# Patient Record
Sex: Female | Born: 1965 | Race: White | Hispanic: No | Marital: Married | Smoking: Never smoker
Health system: Southern US, Community
[De-identification: ages and names within clinical notes are randomized; demographics above are authoritative.]

## PROBLEM LIST (undated history)

## (undated) DIAGNOSIS — G43909 Migraine, unspecified, not intractable, without status migrainosus: Secondary | ICD-10-CM

## (undated) HISTORY — DX: Migraine, unspecified, not intractable, without status migrainosus: G43.909

---

## 2008-09-16 ENCOUNTER — Ambulatory Visit: Payer: Self-pay

## 2008-10-19 ENCOUNTER — Ambulatory Visit: Payer: Self-pay

## 2011-10-11 ENCOUNTER — Ambulatory Visit: Payer: Self-pay

## 2015-03-02 ENCOUNTER — Encounter: Payer: Self-pay | Admitting: *Deleted

## 2015-03-11 ENCOUNTER — Ambulatory Visit: Payer: Self-pay | Admitting: General Surgery

## 2015-03-25 ENCOUNTER — Encounter: Payer: Self-pay | Admitting: General Surgery

## 2015-03-25 ENCOUNTER — Ambulatory Visit (INDEPENDENT_AMBULATORY_CARE_PROVIDER_SITE_OTHER): Payer: No Typology Code available for payment source | Admitting: General Surgery

## 2015-03-25 VITALS — BP 100/60 | HR 80 | Resp 12 | Ht 61.0 in | Wt 149.0 lb

## 2015-03-25 DIAGNOSIS — Z1239 Encounter for other screening for malignant neoplasm of breast: Secondary | ICD-10-CM

## 2015-03-25 NOTE — Patient Instructions (Signed)
Continue self breast exams. Call office for any new breast issues or concerns. 

## 2015-03-25 NOTE — Progress Notes (Signed)
Patient ID: Vanessa Berry, female   DOB: 04-13-1966, 49 y.o.   MRN: [id number]  Chief Complaint  Patient presents with  . Other    left breast mass    HPI Vanessa Berry is a 49 y.o. female here for evaluation of a left breast mass. Her most recent mammogram was on 01/26/15 and was normal.  She states the area has been there since 2013 when she had an ultrasound as well but no biopsy has been done. Patient does perform regular self breast checks and gets regular mammograms done.  Denies family history of breast cancer. Denies any breast trauma.    HPI  Past Medical History  Diagnosis Date  . Migraine     Past Surgical History  Procedure Laterality Date  . Cesarean section  08-01-90    No family history on file.  Social History History  Substance Use Topics  . Smoking status: Never Smoker   . Smokeless tobacco: Never Used  . Alcohol Use: No    No Known Allergies  Current Outpatient Prescriptions  Medication Sig Dispense Refill  . aspirin-acetaminophen-caffeine (EXCEDRIN MIGRAINE) 250-250-65 MG per tablet Take 1 tablet by mouth every 6 (six) hours as needed for headache.     No current facility-administered medications for this visit.    Review of Systems Review of Systems  Constitutional: Negative.   Respiratory: Negative.   Cardiovascular: Negative.     Blood pressure 100/60, pulse 80, resp. rate 12, height 5\' 1"  (1.549 m), weight 149 lb (67.586 kg), last menstrual period 03/04/2015.  Physical Exam Physical Exam  Constitutional: She is oriented to person, place, and time. She appears well-developed and well-nourished.  Neck: Neck supple.  Cardiovascular: Normal rate, regular rhythm and normal heart sounds.   Pulmonary/Chest: Effort normal and breath sounds normal. Right breast exhibits no inverted nipple, no mass, no nipple discharge, no skin change and no tenderness. Left breast exhibits no inverted nipple, no mass, no nipple discharge, no skin change and no  tenderness.    Lymphadenopathy:    She has no cervical adenopathy.    She has no axillary adenopathy.  Neurological: She is alert and oriented to person, place, and time.  Skin: Skin is warm and dry.    Data Reviewed Office notes from Verlene Mayer, M.D. dated 03/02/2015 were reviewed. Small lesion in the left breast 2:00 discussed. No change from March 2016 exam.  Imaging studies dated 10/11/2011 in the upper outer aspect of the right breast purported 11 mm cyst. Emma grams otherwise unremarkable.  Mammogram dated 01/26/2015 completed at [facility] showed extremely dense breasts without suspicious mass or calcification. BI-RADS-1.  Assessment    Benign breast exam, past history breast cyst.    Plan    Oh intervention is required at this time. Annual screening mammograms will be appropriate.     Follow up as needed. Continue self breast exams. Call office for any new breast issues or concerns.   PCP:  No Pcp  Ref: Dr Wynona Luna, [place] 03/27/2015, 10:25 AM

## 2015-03-27 DIAGNOSIS — Z0000 Encounter for general adult medical examination without abnormal findings: Secondary | ICD-10-CM | POA: Insufficient documentation

## 2017-07-26 ENCOUNTER — Ambulatory Visit (INDEPENDENT_AMBULATORY_CARE_PROVIDER_SITE_OTHER): Payer: No Typology Code available for payment source | Admitting: Obstetrics & Gynecology

## 2017-07-26 ENCOUNTER — Encounter: Payer: Self-pay | Admitting: Obstetrics & Gynecology

## 2017-07-26 VITALS — BP 122/80 | HR 81 | Ht 61.0 in | Wt 160.0 lb

## 2017-07-26 DIAGNOSIS — Z1329 Encounter for screening for other suspected endocrine disorder: Secondary | ICD-10-CM

## 2017-07-26 DIAGNOSIS — Z124 Encounter for screening for malignant neoplasm of cervix: Secondary | ICD-10-CM

## 2017-07-26 DIAGNOSIS — Z1231 Encounter for screening mammogram for malignant neoplasm of breast: Secondary | ICD-10-CM

## 2017-07-26 DIAGNOSIS — N812 Incomplete uterovaginal prolapse: Secondary | ICD-10-CM | POA: Diagnosis not present

## 2017-07-26 DIAGNOSIS — Z0000 Encounter for general adult medical examination without abnormal findings: Secondary | ICD-10-CM | POA: Diagnosis not present

## 2017-07-26 DIAGNOSIS — Z131 Encounter for screening for diabetes mellitus: Secondary | ICD-10-CM | POA: Diagnosis not present

## 2017-07-26 DIAGNOSIS — Z1321 Encounter for screening for nutritional disorder: Secondary | ICD-10-CM | POA: Diagnosis not present

## 2017-07-26 DIAGNOSIS — Z1239 Encounter for other screening for malignant neoplasm of breast: Secondary | ICD-10-CM

## 2017-07-26 DIAGNOSIS — Z13220 Encounter for screening for lipoid disorders: Secondary | ICD-10-CM

## 2017-07-26 DIAGNOSIS — N8111 Cystocele, midline: Secondary | ICD-10-CM

## 2017-07-26 NOTE — Patient Instructions (Signed)
PAP every three years Mammogram every year    Call [phone] to schedule at [facility] Colonoscopy every 10 years Labs yearly 

## 2017-07-26 NOTE — Progress Notes (Signed)
HPI:      Ms. Vanessa Berry is a 51 y.o. G3P0 who LMP was Patient's last menstrual period was 07/08/2017., she presents today for her annual examination. The patient has no complaints today. The patient is sexually active. Her last pap: approximate date 2016 and was normal and last mammogram: approximate date 2016 and was normal. The patient does perform self breast exams.  There is no notable family history of breast or ovarian cancer in her family.  The patient has regular exercise: yes.  The patient denies current symptoms of depression.    C/o stress urinary incontinence sx's of LOU with cough, exercise.  Wears pad daily.  No nocturia or freq.  Also c/o perineal discomfort at times from where epis. done several years ago.  Reg cycles. No menopausal sx's.  GYN History: Contraception: condoms  PMHx: Past Medical History:  Diagnosis Date  . Migraine    Past Surgical History:  Procedure Laterality Date  . CESAREAN SECTION  08-01-90   Family History  Problem Relation Age of Onset  . Cancer Mother   . Hyperlipidemia Mother    Social History  Substance Use Topics  . Smoking status: Never Smoker  . Smokeless tobacco: Never Used  . Alcohol use No    Current Outpatient Prescriptions:  .  aspirin-acetaminophen-caffeine (EXCEDRIN MIGRAINE) 250-250-65 MG per tablet, Take 1 tablet by mouth every 6 (six) hours as needed for headache., Disp: , Rfl:  Allergies: Patient has no known allergies.  Review of Systems  Constitutional: Negative for chills, fever and malaise/fatigue.  HENT: Negative for congestion, sinus pain and sore throat.   Eyes: Negative for blurred vision and pain.  Respiratory: Negative for cough and wheezing.   Cardiovascular: Negative for chest pain and leg swelling.  Gastrointestinal: Negative for abdominal pain, constipation, diarrhea, heartburn, nausea and vomiting.  Genitourinary: Negative for dysuria, frequency, hematuria and urgency.  Musculoskeletal:  Negative for back pain, joint pain, myalgias and neck pain.  Skin: Negative for itching and rash.  Neurological: Negative for dizziness, tremors and weakness.  Endo/Heme/Allergies: Does not bruise/bleed easily.  Psychiatric/Behavioral: Negative for depression. The patient is not nervous/anxious and does not have insomnia.     Objective: BP 122/80   Pulse 81   Ht 5\' 1"  (1.549 m)   Wt 160 lb (72.6 kg)   LMP 07/08/2017   BMI 30.23 kg/m   Filed Weights   07/26/17 1010  Weight: 160 lb (72.6 kg)   Body mass index is 30.23 kg/m. Physical Exam  Constitutional: She is oriented to person, place, and time. She appears well-developed and well-nourished. No distress.  Genitourinary: Rectum normal, vagina normal and uterus normal. Pelvic exam was performed with patient supine. There is no rash or lesion on the right labia. There is no rash or lesion on the left labia. Vagina exhibits no lesion. No bleeding in the vagina. Right adnexum does not display mass and does not display tenderness. Left adnexum does not display mass and does not display tenderness. Cervix does not exhibit motion tenderness, lesion, friability or polyp.   Uterus is mobile and anteverted. Uterus is not enlarged or exhibiting a mass.  Genitourinary Comments: Gr 2 uterine prolapse into vagina, anteverted as well into bladder Gr 2 cystocele Mild posterior perineal disruption from prior episiotomy  HENT:  Head: Normocephalic and atraumatic. Head is without laceration.  Right Ear: Hearing normal.  Left Ear: Hearing normal.  Nose: No epistaxis.  No foreign bodies.  Mouth/Throat: Uvula is midline, oropharynx  is clear and moist and mucous membranes are normal.  Eyes: Pupils are equal, round, and reactive to light.  Neck: Normal range of motion. Neck supple. No thyromegaly present.  Cardiovascular: Normal rate and regular rhythm.  Exam reveals no gallop and no friction rub.   No murmur heard. Pulmonary/Chest: Effort normal and  breath sounds normal. No respiratory distress. She has no wheezes. Right breast exhibits no mass, no skin change and no tenderness. Left breast exhibits no mass, no skin change and no tenderness.  Abdominal: Soft. Bowel sounds are normal. She exhibits no distension. There is no tenderness. There is no rebound.  Musculoskeletal: Normal range of motion.  Neurological: She is alert and oriented to person, place, and time. No cranial nerve deficit.  Skin: Skin is warm and dry.  Psychiatric: She has a normal mood and affect. Judgment normal.  Vitals reviewed.   Assessment:  ANNUAL EXAM 1. Annual physical exam   2. Screening for cervical cancer   3. Screening for breast cancer   4. Encounter for vitamin deficiency screening   5. Screening for cholesterol level   6. Screening for thyroid disorder   7. Screening for diabetes mellitus      Screening Plan:            1.  Cervical Screening-  Pap smear done today  2. Breast screening- Exam annually and mammogram>40 planned   3. Colonoscopy every 10 years, Hemoccult testing - after age 60  4. Labs To return fasting at a later date  5. Counseling for contraception: no method  Other:  1. Annual physical exam  2. Screening for cervical cancer - IGP, Aptima HPV  3. Screening for breast cancer - MM DIGITAL SCREENING BILATERAL; Future  4. Encounter for vitamin deficiency screening - VITAMIN D 25 Hydroxy (Vit-D Deficiency, Fractures); Future  5. Screening for cholesterol level - Lipid panel; Future  6. Screening for thyroid disorder - TSH; Future  7. Screening for diabetes mellitus - Hemoglobin A1c; Future  Counseled about tx for prolapse and GSI- pessary vs surgery; would rec TLH BS, Ant Repair and TVT sling, also with perineorrhaphy.  To consider.    F/U  Return in about 1 year (around 07/26/2018) for Annual, also lab visit soon.  Barnett Applebaum, MD, [facility], [facility] 07/26/2017  10:42 AM

## 2017-07-27 ENCOUNTER — Other Ambulatory Visit: Payer: No Typology Code available for payment source

## 2017-07-27 DIAGNOSIS — Z1321 Encounter for screening for nutritional disorder: Secondary | ICD-10-CM

## 2017-07-27 DIAGNOSIS — Z13220 Encounter for screening for lipoid disorders: Secondary | ICD-10-CM

## 2017-07-27 DIAGNOSIS — Z131 Encounter for screening for diabetes mellitus: Secondary | ICD-10-CM

## 2017-07-27 DIAGNOSIS — Z1329 Encounter for screening for other suspected endocrine disorder: Secondary | ICD-10-CM

## 2017-07-28 LAB — IGP, APTIMA HPV
HPV Aptima: NEGATIVE
PAP Smear Comment: 0

## 2017-07-28 LAB — LIPID PANEL
CHOLESTEROL TOTAL: 186 mg/dL (ref 100–199)
Chol/HDL Ratio: 3.2 ratio (ref 0.0–4.4)
HDL: 59 mg/dL (ref >39)
LDL Calculated: 111 mg/dL — ABNORMAL HIGH (ref 0–99)
TRIGLYCERIDES: 78 mg/dL (ref 0–149)
VLDL Cholesterol Cal: 16 mg/dL (ref 5–40)

## 2017-07-28 LAB — TSH: TSH: 4.45 u[IU]/mL (ref 0.450–4.500)

## 2017-07-28 LAB — VITAMIN D 25 HYDROXY (VIT D DEFICIENCY, FRACTURES): VIT D 25 HYDROXY: 41 ng/mL (ref 30.0–100.0)

## 2017-07-28 LAB — HEMOGLOBIN A1C
Est. average glucose Bld gHb Est-mCnc: 103 mg/dL
Hgb A1c MFr Bld: 5.2 % (ref 4.8–5.6)

## 2017-07-30 ENCOUNTER — Encounter: Payer: Self-pay | Admitting: Obstetrics & Gynecology

## 2017-08-16 ENCOUNTER — Ambulatory Visit
Admission: RE | Admit: 2017-08-16 | Discharge: 2017-08-16 | Disposition: A | Discharge status: Home / Self Care | Payer: No Typology Code available for payment source | Source: Ambulatory Visit | Attending: Obstetrics & Gynecology | Admitting: Obstetrics & Gynecology

## 2017-08-16 DIAGNOSIS — Z1231 Encounter for screening mammogram for malignant neoplasm of breast: Secondary | ICD-10-CM | POA: Diagnosis present

## 2017-08-16 DIAGNOSIS — Z1239 Encounter for other screening for malignant neoplasm of breast: Secondary | ICD-10-CM

## 2018-01-28 ENCOUNTER — Telehealth: Payer: Self-pay | Admitting: General Practice

## 2018-01-28 NOTE — Telephone Encounter (Signed)
Please advise 

## 2018-01-28 NOTE — Telephone Encounter (Unsigned)
Copied from [facility] [phone]. Topic: Appointment Scheduling - New Patient >> Jan 28, 2018  1:08 PM Hewitt Shorts wrote: Pt is requesting that she become a patient  of Dr. Nicki Reaper -Vania Rea referred her to Dr. Marya Landry number [phone]

## 2018-01-30 NOTE — Telephone Encounter (Signed)
Please advise 

## 2018-01-31 NOTE — Telephone Encounter (Signed)
Ok

## 2018-02-01 NOTE — Telephone Encounter (Signed)
Lm to call back and schedule and ask for [facility]

## 2021-03-21 ENCOUNTER — Ambulatory Visit (INDEPENDENT_AMBULATORY_CARE_PROVIDER_SITE_OTHER): Payer: BC Managed Care – PPO | Admitting: Dermatology

## 2021-03-21 ENCOUNTER — Other Ambulatory Visit: Payer: Self-pay

## 2021-03-21 DIAGNOSIS — D485 Neoplasm of uncertain behavior of skin: Secondary | ICD-10-CM

## 2021-03-21 DIAGNOSIS — Z1283 Encounter for screening for malignant neoplasm of skin: Secondary | ICD-10-CM | POA: Diagnosis not present

## 2021-03-21 DIAGNOSIS — D229 Melanocytic nevi, unspecified: Secondary | ICD-10-CM | POA: Diagnosis not present

## 2021-03-21 DIAGNOSIS — L814 Other melanin hyperpigmentation: Secondary | ICD-10-CM

## 2021-03-21 DIAGNOSIS — B009 Herpesviral infection, unspecified: Secondary | ICD-10-CM | POA: Diagnosis not present

## 2021-03-21 DIAGNOSIS — D1800 Hemangioma unspecified site: Secondary | ICD-10-CM

## 2021-03-21 DIAGNOSIS — C44519 Basal cell carcinoma of skin of other part of trunk: Secondary | ICD-10-CM

## 2021-03-21 DIAGNOSIS — C4491 Basal cell carcinoma of skin, unspecified: Secondary | ICD-10-CM

## 2021-03-21 DIAGNOSIS — L821 Other seborrheic keratosis: Secondary | ICD-10-CM

## 2021-03-21 HISTORY — DX: Basal cell carcinoma of skin, unspecified: C44.91

## 2021-03-21 MED ORDER — VALACYCLOVIR HCL 1 G PO TABS: ORAL_TABLET | ORAL | 11 refills | Status: AC

## 2021-03-21 NOTE — Progress Notes (Signed)
New Patient Visit  Subjective  Vanessa Berry is a 55 y.o. female who presents for the following: skin lesions (On the buttocks and back - itchy and irritated). She also has a fever blister outbreak.  She also has other spots she would like evaluated.  The following portions of the chart were reviewed this encounter and updated as appropriate:   Tobacco  Allergies  Meds  Problems  Med Hx  Surg Hx  Fam Hx     Review of Systems:  No other skin or systemic complaints except as noted in HPI or Assessment and Plan.  Objective  Well appearing patient in no apparent distress; mood and affect are within normal limits.  A focused examination was performed including the back and buttocks. Relevant physical exam findings are noted in the Assessment and Plan.  Objective  R Mid Back: 0.6 cm pink papule       Objective  R chin: Healing ulceration on the R chin    Assessment & Plan  Neoplasm of uncertain behavior of skin R Mid Back  Skin / nail biopsy Type of biopsy: tangential   Informed consent: discussed and consent obtained   Timeout: patient name, date of birth, surgical site, and procedure verified   Procedure prep:  Patient was prepped and draped in usual sterile fashion Prep type:  Isopropyl alcohol Anesthesia: the lesion was anesthetized in a standard fashion   Anesthetic:  1% lidocaine w/ epinephrine 1-100,000 buffered w/ 8.4% NaHCO3 Instrument used: flexible razor blade   Hemostasis achieved with: pressure, aluminum chloride and electrodesiccation   Outcome: patient tolerated procedure well   Post-procedure details: sterile dressing applied and wound care instructions given   Dressing type: bandage and petrolatum    Specimen 1 - Surgical pathology Differential Diagnosis: D48.5 ISK vs BCC vs dysplastic nevus vs other  Check Margins: No 0.6 cm pink papule  If today's NOB + for abnormalities - recommend tx of other lesions on the R upper back and L  buttock  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  It is infectious and can be spread from person to person by direct contact.  It is not curable, but is treatable with topical and oral medication.   valACYclovir (VALTREX) 1000 MG tablet - R chin  Lentigines - Scattered tan macules - Due to sun exposure - Benign-appering, observe - Recommend daily broad spectrum sunscreen SPF 30+ to sun-exposed areas, reapply every 2 hours as needed. - Call for any changes  Hemangiomas - Red papules - Discussed benign nature - Observe - Call for any changes  Melanocytic Nevi - Tan-brown and/or pink-flesh-colored symmetric macules and papules - Benign appearing on exam today - Observation - Call clinic for new or changing moles - Recommend daily use of broad spectrum spf 30+ sunscreen to sun-exposed areas.   Seborrheic Keratoses - Stuck-on, waxy, tan-brown papules and/or plaques  - Benign-appearing - Discussed benign etiology and prognosis. - Observe - Call for any changes  Return for follow pending pathology results.  Luther Redo, CMA, am acting as scribe for Sarina Ser, MD .  Documentation: I have reviewed the above documentation for accuracy and completeness, and I agree with the above.  Sarina Ser, MD

## 2021-03-21 NOTE — Patient Instructions (Addendum)
If you have any questions or concerns for your doctor, please call our main line at [phone] and press option 4 to reach your doctor's medical assistant. If no one answers, please leave a voicemail as directed and we will return your call as soon as possible. Messages left after 4 pm will be answered the following business day.   You may also send us a message via MyChart. We typically respond to MyChart messages within 1-2 business days.  For prescription refills, please ask your pharmacy to contact our office. Our fax number is [phone].  If you have an urgent issue when the clinic is closed that cannot wait until the next business day, you can page your doctor at the number below.    Please note that while we do our best to be available for urgent issues outside of office hours, we are not available 24/7.   If you have an urgent issue and are unable to reach us, you may choose to seek medical care at your doctor's office, retail clinic, urgent care center, or emergency room.  If you have a medical emergency, please immediately call 911 or go to the emergency department.  Pager Numbers  - Dr. Kowalski: [phone]  - Dr. Moye: [phone]  - Dr. Stewart: [phone]  In the event of inclement weather, please call our main line at [phone] for an update on the status of any delays or closures.  Dermatology Medication Tips: Please keep the boxes that topical medications come in in order to help keep track of the instructions about where and how to use these. Pharmacies typically print the medication instructions only on the boxes and not directly on the medication tubes.   If your medication is too expensive, please contact our office at [phone] option 4 or send us a message through MyChart.   We are unable to tell what your co-pay for medications will be in advance as this is different depending on your insurance coverage. However, we may be able to find a  substitute medication at lower cost or fill out paperwork to get insurance to cover a needed medication.   If a prior authorization is required to get your medication covered by your insurance company, please allow us 1-2 business days to complete this process.  Drug prices often vary depending on where the prescription is filled and some pharmacies may offer cheaper prices.  The website www.goodrx.com contains coupons for medications through different pharmacies. The prices here do not account for what the cost may be with help from insurance (it may be cheaper with your insurance), but the website can give you the price if you did not use any insurance.  - You can print the associated coupon and take it with your prescription to the pharmacy.  - You may also stop by our office during regular business hours and pick up a GoodRx coupon card.  - If you need your prescription sent electronically to a different pharmacy, notify our office through [facility] MyChart or by phone at [phone] option 4.     Wound Care Instructions  1. Cleanse wound gently with soap and water once a day then pat dry with clean gauze. Apply a thing coat of Petrolatum (petroleum jelly, "Vaseline") over the wound (unless you have an allergy to this). We recommend that you use a new, sterile tube of Vaseline. Do not pick or remove scabs. Do not remove the yellow or white "healing tissue" from the base of the wound.    2. Cover the wound with fresh, clean, nonstick gauze and secure with paper tape. You may use Band-Aids in place of gauze and tape if the would is small enough, but would recommend trimming much of the tape off as there is often too much. Sometimes Band-Aids can irritate the skin.  3. You should call the office for your biopsy report after 1 week if you have not already been contacted.  4. If you experience any problems, such as abnormal amounts of bleeding, swelling, significant bruising, significant pain,  or evidence of infection, please call the office immediately.  5. FOR ADULT SURGERY PATIENTS: If you need something for pain relief you may take 1 extra strength Tylenol (acetaminophen) AND 2 Ibuprofen (200mg each) together every 4 hours as needed for pain. (do not take these if you are allergic to them or if you have a reason you should not take them.) Typically, you may only need pain medication for 1 to 3 days.     

## 2021-03-29 ENCOUNTER — Encounter: Payer: Self-pay | Admitting: Dermatology

## 2021-03-29 ENCOUNTER — Telehealth: Payer: Self-pay

## 2021-03-29 NOTE — Telephone Encounter (Signed)
Discussed biopsy results with pt return to the office for surgery

## 2021-03-29 NOTE — Telephone Encounter (Signed)
-----   Message from Ralene Bathe, MD sent at 03/28/2021  9:57 AM EDT ----- Diagnosis Skin , right mid back BASAL CELL CARCINOMA, NODULAR PATTERN, BASE INVOLVED  Cancer - BCC Schedule surgery

## 2021-05-03 ENCOUNTER — Other Ambulatory Visit: Payer: Self-pay

## 2021-05-03 ENCOUNTER — Ambulatory Visit (INDEPENDENT_AMBULATORY_CARE_PROVIDER_SITE_OTHER): Payer: BC Managed Care – PPO | Admitting: Dermatology

## 2021-05-03 ENCOUNTER — Telehealth: Payer: Self-pay

## 2021-05-03 DIAGNOSIS — C44519 Basal cell carcinoma of skin of other part of trunk: Secondary | ICD-10-CM | POA: Diagnosis not present

## 2021-05-03 DIAGNOSIS — L578 Other skin changes due to chronic exposure to nonionizing radiation: Secondary | ICD-10-CM | POA: Diagnosis not present

## 2021-05-03 DIAGNOSIS — L820 Inflamed seborrheic keratosis: Secondary | ICD-10-CM | POA: Diagnosis not present

## 2021-05-03 DIAGNOSIS — D225 Melanocytic nevi of trunk: Secondary | ICD-10-CM

## 2021-05-03 DIAGNOSIS — D239 Other benign neoplasm of skin, unspecified: Secondary | ICD-10-CM

## 2021-05-03 DIAGNOSIS — D485 Neoplasm of uncertain behavior of skin: Secondary | ICD-10-CM

## 2021-05-03 HISTORY — DX: Other benign neoplasm of skin, unspecified: D23.9

## 2021-05-03 MED ORDER — MUPIROCIN 2 % EX OINT: 1.0000 "application " | TOPICAL_OINTMENT | Freq: Every day | CUTANEOUS | 0 refills | Status: AC

## 2021-05-03 NOTE — Progress Notes (Signed)
Follow-Up Visit   Subjective  Vanessa Berry is a 55 y.o. female who presents for the following: Basal Cell Carcinoma (Biopsy proven BCC of right mid back) and Other (Recheck spots of right upper back and left buttock).  The following portions of the chart were reviewed this encounter and updated as appropriate:   Tobacco  Allergies  Meds  Problems  Med Hx  Surg Hx  Fam Hx     Review of Systems:  No other skin or systemic complaints except as noted in HPI or Assessment and Plan.  Objective  Well appearing patient in no apparent distress; mood and affect are within normal limits.  A focused examination was performed including back, buttock. Relevant physical exam findings are noted in the Assessment and Plan.  Right mid back Healing biopsy site  Right Upper Back paraspinal 0.8 cm pink papule  Right sup medial buttock 0.6 cm irregular brown macule  Left sup medial buttock 1.0 cm pink papule  Right Upper Back Medial scapula (3) Erythematous keratotic or waxy stuck-on papule or plaque.    Assessment & Plan  Basal cell carcinoma (BCC) of skin of other part of torso Right mid back  Skin excision  Lesion length (cm):  1 Lesion width (cm):  0.7 Margin per side (cm):  0.2 Total excision diameter (cm):  1.4 Informed consent: discussed and consent obtained   Timeout: patient name, date of birth, surgical site, and procedure verified   Procedure prep:  Patient was prepped and draped in usual sterile fashion Prep type:  Isopropyl alcohol and povidone-iodine Anesthesia: the lesion was anesthetized in a standard fashion   Anesthetic:  1% lidocaine w/ epinephrine 1-100,000 buffered w/ 8.4% NaHCO3 Instrument used: #15 blade   Hemostasis achieved with: pressure   Hemostasis achieved with comment:  Electrocautery Outcome: patient tolerated procedure well with no complications   Post-procedure details: sterile dressing applied and wound care instructions given   Dressing  type: bandage and pressure dressing (mupirocin)    Skin repair Complexity:  Complex Final length (cm):  3 Reason for type of repair: reduce tension to allow closure, reduce the risk of dehiscence, infection, and necrosis, reduce subcutaneous dead space and avoid a hematoma, allow closure of the large defect, preserve normal anatomy, preserve normal anatomical and functional relationships and enhance both functionality and cosmetic results   Undermining: area extensively undermined   Undermining comment:  Undermining defect 1.2 cm Subcutaneous layers (deep stitches):  Suture size:  2-0 Suture type: Vicryl (polyglactin 910)   Subcutaneous suture technique: inverted dermal. Fine/surface layer approximation (top stitches):  Suture size:  3-0 Suture type: nylon   Stitches: simple running   Suture removal (days):  7 Hemostasis achieved with: suture and pressure Outcome: patient tolerated procedure well with no complications   Post-procedure details: sterile dressing applied and wound care instructions given   Dressing type: bandage and pressure dressing (mupirocin)    mupirocin ointment (BACTROBAN) 2 % Apply 1 application topically daily. With dressing changes  Specimen 1 - Surgical pathology Differential Diagnosis: Biopsy proven BCC Check Margins: No [id number]  Neoplasm of uncertain behavior of skin (3) Right Upper Back paraspinal  Epidermal / dermal shaving  Lesion diameter (cm):  0.8 Informed consent: discussed and consent obtained   Timeout: patient name, date of birth, surgical site, and procedure verified   Procedure prep:  Patient was prepped and draped in usual sterile fashion Prep type:  Isopropyl alcohol Anesthesia: the lesion was anesthetized in a standard fashion  Anesthetic:  1% lidocaine w/ epinephrine 1-100,000 buffered w/ 8.4% NaHCO3 Instrument used: flexible razor blade   Hemostasis achieved with: pressure, aluminum chloride and electrodesiccation   Outcome:  patient tolerated procedure well   Post-procedure details: sterile dressing applied and wound care instructions given   Dressing type: bandage and petrolatum    Destruction of lesion Complexity: extensive   Destruction method: electrodesiccation and curettage   Informed consent: discussed and consent obtained   Timeout:  patient name, date of birth, surgical site, and procedure verified Procedure prep:  Patient was prepped and draped in usual sterile fashion Prep type:  Isopropyl alcohol Anesthesia: the lesion was anesthetized in a standard fashion   Anesthetic:  1% lidocaine w/ epinephrine 1-100,000 buffered w/ 8.4% NaHCO3 Curettage performed in three different directions: Yes   Electrodesiccation performed over the curetted area: Yes   Lesion length (cm):  0.8 Lesion width (cm):  0.8 Margin per side (cm):  0.2 Final wound size (cm):  1.2 Hemostasis achieved with:  pressure and aluminum chloride Outcome: patient tolerated procedure well with no complications   Post-procedure details: sterile dressing applied and wound care instructions given   Dressing type: bandage and petrolatum    Specimen 2 - Surgical pathology Differential Diagnosis: BCC vs other  Check Margins: No EDC today  Right sup medial buttock  Epidermal / dermal shaving  Lesion diameter (cm):  0.6 Informed consent: discussed and consent obtained   Timeout: patient name, date of birth, surgical site, and procedure verified   Procedure prep:  Patient was prepped and draped in usual sterile fashion Prep type:  Isopropyl alcohol Anesthesia: the lesion was anesthetized in a standard fashion   Anesthetic:  1% lidocaine w/ epinephrine 1-100,000 buffered w/ 8.4% NaHCO3 Instrument used: flexible razor blade   Hemostasis achieved with: pressure, aluminum chloride and electrodesiccation   Outcome: patient tolerated procedure well   Post-procedure details: sterile dressing applied and wound care instructions given    Dressing type: bandage and petrolatum    Specimen 3 - Surgical pathology Differential Diagnosis: Nevus vs dysplastic nevus Check Margins: No  Left sup medial buttock  Epidermal / dermal shaving  Lesion diameter (cm):  1 Informed consent: discussed and consent obtained   Timeout: patient name, date of birth, surgical site, and procedure verified   Procedure prep:  Patient was prepped and draped in usual sterile fashion Prep type:  Isopropyl alcohol Anesthesia: the lesion was anesthetized in a standard fashion   Anesthetic:  1% lidocaine w/ epinephrine 1-100,000 buffered w/ 8.4% NaHCO3 Instrument used: flexible razor blade   Hemostasis achieved with: pressure, aluminum chloride and electrodesiccation   Outcome: patient tolerated procedure well   Post-procedure details: sterile dressing applied and wound care instructions given   Dressing type: bandage and petrolatum    Destruction of lesion Complexity: extensive   Destruction method: electrodesiccation and curettage   Informed consent: discussed and consent obtained   Timeout:  patient name, date of birth, surgical site, and procedure verified Procedure prep:  Patient was prepped and draped in usual sterile fashion Prep type:  Isopropyl alcohol Anesthesia: the lesion was anesthetized in a standard fashion   Anesthetic:  1% lidocaine w/ epinephrine 1-100,000 buffered w/ 8.4% NaHCO3 Curettage performed in three different directions: Yes   Electrodesiccation performed over the curetted area: Yes   Lesion length (cm):  1 Lesion width (cm):  1 Margin per side (cm):  0.2 Final wound size (cm):  1.4 Hemostasis achieved with:  pressure and aluminum chloride Outcome:  patient tolerated procedure well with no complications   Post-procedure details: sterile dressing applied and wound care instructions given   Dressing type: bandage and petrolatum    Specimen 4 - Surgical pathology Differential Diagnosis: BCC vs other  Check Margins:  No EDC today  Inflamed seborrheic keratosis Right Upper Back Medial scapula  Destruction of lesion - Right Upper Back Medial scapula Complexity: simple   Destruction method: cryotherapy   Informed consent: discussed and consent obtained   Timeout:  patient name, date of birth, surgical site, and procedure verified Lesion destroyed using liquid nitrogen: Yes   Region frozen until ice ball extended beyond lesion: Yes   Outcome: patient tolerated procedure well with no complications   Post-procedure details: wound care instructions given    Actinic Damage - chronic, secondary to cumulative UV radiation exposure/sun exposure over time - diffuse scaly erythematous macules with underlying dyspigmentation - Recommend daily broad spectrum sunscreen SPF 30+ to sun-exposed areas, reapply every 2 hours as needed.  - Recommend staying in the shade or wearing long sleeves, sun glasses (UVA+UVB protection) and wide brim hats (4-inch brim around the entire circumference of the hat). - Call for new or changing lesions.  Return in about 1 week (around 05/10/2021) for suture removal then 7-8 months with Dr. Nehemiah Massed.  I, Ashok Cordia, CMA, am acting as scribe for Sarina Ser, MD .  Documentation: I have reviewed the above documentation for accuracy and completeness, and I agree with the above.  Sarina Ser, MD

## 2021-05-03 NOTE — Telephone Encounter (Signed)
Left message for patient regarding surgery/hd  

## 2021-05-03 NOTE — Patient Instructions (Signed)
Wound Care Instructions  Cleanse wound gently with soap and water once a day then pat dry with clean gauze. Apply a thing coat of Petrolatum (petroleum jelly, "Vaseline") over the wound (unless you have an allergy to this). We recommend that you use a new, sterile tube of Vaseline. Do not pick or remove scabs. Do not remove the yellow or white "healing tissue" from the base of the wound.  Cover the wound with fresh, clean, nonstick gauze and secure with paper tape. You may use Band-Aids in place of gauze and tape if the would is small enough, but would recommend trimming much of the tape off as there is often too much. Sometimes Band-Aids can irritate the skin.  You should call the office for your biopsy report after 1 week if you have not already been contacted.  If you experience any problems, such as abnormal amounts of bleeding, swelling, significant bruising, significant pain, or evidence of infection, please call the office immediately.  FOR ADULT SURGERY PATIENTS: If you need something for pain relief you may take 1 extra strength Tylenol (acetaminophen) AND 2 Ibuprofen (200mg each) together every 4 hours as needed for pain. (do not take these if you are allergic to them or if you have a reason you should not take them.) Typically, you may only need pain medication for 1 to 3 days.    Cryotherapy Aftercare  Wash gently with soap and water everyday.   Apply Vaseline and Band-Aid daily until healed.    If you have any questions or concerns for your doctor, please call our main line at [phone] and press option 4 to reach your doctor's medical assistant. If no one answers, please leave a voicemail as directed and we will return your call as soon as possible. Messages left after 4 pm will be answered the following business day.   You may also send us a message via MyChart. We typically respond to MyChart messages within 1-2 business days.  For prescription refills, please ask your  pharmacy to contact our office. Our fax number is [phone].  If you have an urgent issue when the clinic is closed that cannot wait until the next business day, you can page your doctor at the number below.    Please note that while we do our best to be available for urgent issues outside of office hours, we are not available 24/7.   If you have an urgent issue and are unable to reach us, you may choose to seek medical care at your doctor's office, retail clinic, urgent care center, or emergency room.  If you have a medical emergency, please immediately call 911 or go to the emergency department.  Pager Numbers  - Dr. Kowalski: [phone]  - Dr. Moye: [phone]  - Dr. Stewart: [phone]  In the event of inclement weather, please call our main line at [phone] for an update on the status of any delays or closures.  Dermatology Medication Tips: Please keep the boxes that topical medications come in in order to help keep track of the instructions about where and how to use these. Pharmacies typically print the medication instructions only on the boxes and not directly on the medication tubes.   If your medication is too expensive, please contact our office at [phone] option 4 or send us a message through MyChart.   We are unable to tell what your co-pay for medications will be in advance as this is different depending on your insurance coverage. However,   we may be able to find a substitute medication at lower cost or fill out paperwork to get insurance to cover a needed medication.   If a prior authorization is required to get your medication covered by your insurance company, please allow us 1-2 business days to complete this process.  Drug prices often vary depending on where the prescription is filled and some pharmacies may offer cheaper prices.  The website www.goodrx.com contains coupons for medications through different pharmacies. The prices here do not  account for what the cost may be with help from insurance (it may be cheaper with your insurance), but the website can give you the price if you did not use any insurance.  - You can print the associated coupon and take it with your prescription to the pharmacy.  - You may also stop by our office during regular business hours and pick up a GoodRx coupon card.  - If you need your prescription sent electronically to a different pharmacy, notify our office through [facility] MyChart or by phone at [phone] option 4.  

## 2021-05-10 ENCOUNTER — Ambulatory Visit: Payer: BC Managed Care – PPO

## 2021-05-10 ENCOUNTER — Other Ambulatory Visit: Payer: Self-pay

## 2021-05-10 DIAGNOSIS — Z4802 Encounter for removal of sutures: Secondary | ICD-10-CM

## 2021-05-10 NOTE — Patient Instructions (Signed)

## 2021-05-10 NOTE — Progress Notes (Signed)
   Follow-Up Visit   Subjective  Vanessa Berry is a 55 y.o. female who presents for the following: Suture / Staple Removal (Suture removal for excision on right mid back. Path pending. ).    The following portions of the chart were reviewed this encounter and updated as appropriate:         Objective  Well appearing patient in no apparent distress; mood and affect are within normal limits.    Right Lower Back Incision site is clean, dry and intact    Assessment & Plan  Encounter for removal of sutures Right Lower Back  Encounter for Removal of Sutures - Incision site at the right mid back is clean, dry and intact - Wound cleansed, sutures removed, wound cleansed and steri strips applied.  - Discussed pathology results showing pending  - Patient advised to keep steri-strips dry until they fall off. - Scars remodel for a full year. - Once steri-strips fall off, patient can apply over-the-counter silicone scar cream each night to help with scar remodeling if desired. - Patient advised to call with any concerns or if they notice any new or changing lesions.    No follow-ups on file.  I, Harriett Sine, CMA, am acting as scribe for [employer], CMA.

## 2021-05-14 ENCOUNTER — Encounter: Payer: Self-pay | Admitting: Dermatology

## 2021-05-17 ENCOUNTER — Telehealth: Payer: Self-pay

## 2021-05-17 NOTE — Telephone Encounter (Signed)
-----   Message from Brendolyn Patty, MD sent at 05/17/2021  9:26 AM EDT ----- 1. Skin (M), right mid back EXCISION, NO RESIDUAL BASAL CELL CARCINOMA, MARGINS FREE 2. Skin , right upper back paraspinal SUPERFICIAL BASAL CELL CARCINOMA 3. 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 Skin , left sup medial buttock SUPERFICIAL AND NODULAR BASAL CELL CARCINOMA  1. BCC excision, margins free 2. BCC skin cancer- already treated with EDC at time of biopsy 3. Moderately atypical mole, observe 4. BCC skin cancer- already treated with EDC at time of biopsy   - please call pt

## 2021-05-17 NOTE — Telephone Encounter (Signed)
Advised patient of results/hd  

## 2021-12-14 ENCOUNTER — Other Ambulatory Visit: Payer: Self-pay | Admitting: Internal Medicine

## 2021-12-14 DIAGNOSIS — Z1231 Encounter for screening mammogram for malignant neoplasm of breast: Secondary | ICD-10-CM

## 2021-12-14 DIAGNOSIS — R519 Headache, unspecified: Secondary | ICD-10-CM

## 2021-12-15 ENCOUNTER — Ambulatory Visit: Payer: BC Managed Care – PPO | Admitting: Dermatology

## 2021-12-24 ENCOUNTER — Ambulatory Visit: Payer: BC Managed Care – PPO

## 2022-01-03 ENCOUNTER — Ambulatory Visit: Payer: BC Managed Care – PPO

## 2022-01-11 ENCOUNTER — Other Ambulatory Visit: Payer: Self-pay

## 2022-01-11 ENCOUNTER — Other Ambulatory Visit: Payer: Self-pay | Admitting: Internal Medicine

## 2022-01-11 ENCOUNTER — Ambulatory Visit
Admission: RE | Admit: 2022-01-11 | Discharge: 2022-01-11 | Disposition: A | Discharge status: Home / Self Care | Payer: BC Managed Care – PPO | Source: Ambulatory Visit | Attending: Internal Medicine | Admitting: Internal Medicine

## 2022-01-11 DIAGNOSIS — Z1231 Encounter for screening mammogram for malignant neoplasm of breast: Secondary | ICD-10-CM | POA: Diagnosis not present

## 2022-01-11 DIAGNOSIS — R519 Headache, unspecified: Secondary | ICD-10-CM

## 2022-01-20 ENCOUNTER — Ambulatory Visit
Admission: RE | Admit: 2022-01-20 | Discharge: 2022-01-20 | Disposition: A | Discharge status: Home / Self Care | Payer: BC Managed Care – PPO | Source: Ambulatory Visit | Attending: Internal Medicine | Admitting: Internal Medicine

## 2022-01-20 ENCOUNTER — Other Ambulatory Visit: Payer: Self-pay

## 2022-01-20 DIAGNOSIS — R519 Headache, unspecified: Secondary | ICD-10-CM

## 2022-01-20 MED ORDER — GADOBENATE DIMEGLUMINE 529 MG/ML IV SOLN
14.0000 mL | Freq: Once | INTRAVENOUS | Status: AC | PRN
  Administered 2022-01-20: 14 mL via INTRAVENOUS

## 2022-07-01 IMAGING — MR MR HEAD WO/W CM
13 series · 48 of 48 positions shown · IV contrast (multihance)
Comparison: None.

CLINICAL DATA: 55-year-old female with severe headache. Occipital
headache.

EXAM:
MRI HEAD WITHOUT AND WITH CONTRAST
TECHNIQUE: Multiplanar, multiecho pulse sequences of the brain and surrounding
structures were obtained without and with intravenous contrast.
CONTRAST:  14mL MULTIHANCE GADOBENATE DIMEGLUMINE 529 MG/ML IV SOLN

[Series 5: T1 · sagittal · 4.0mm · 0.75mm/px · 2 of 31 slices shown (1 of 3)]
[im 1/31]
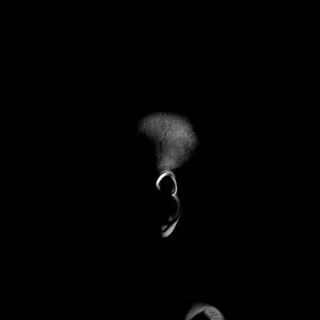
[im 31/31]
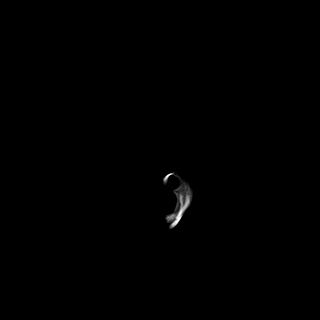

[Series 6: DWI · axial · 3.0mm · 0.94mm/px · z∈[-94,+61]mm · 9 of 176 slices shown (1 of 3)]
[im 1/176]
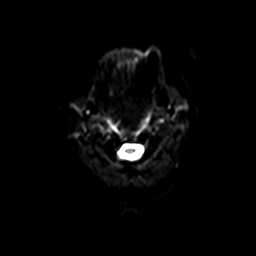
[im 22/176]
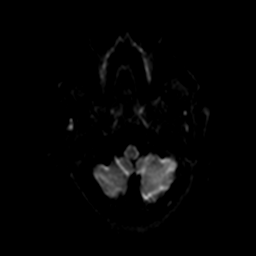
[im 44/176]
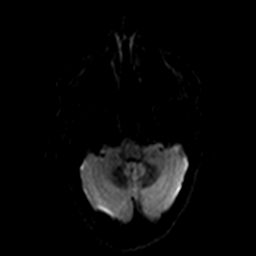
[im 66/176]
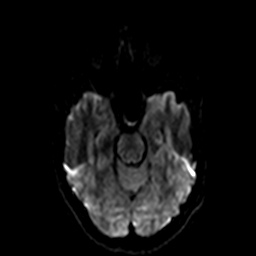
[im 88/176]
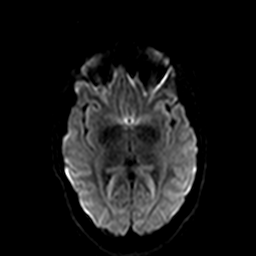
[im 110/176]
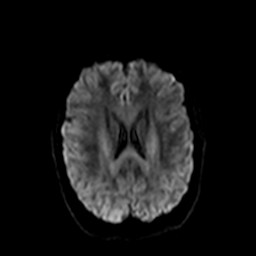
[im 132/176]
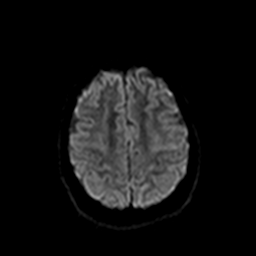
[im 154/176]
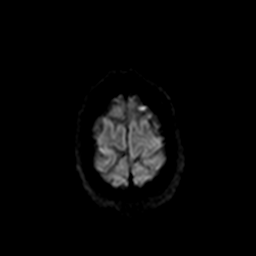
[im 176/176]
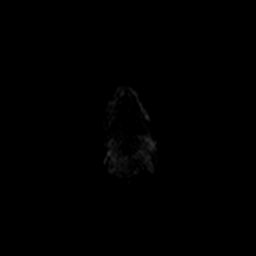

[Series 7: ax dwi_tracew · axial · 3.0mm · 0.94mm/px · z∈[-94,+61]mm · 4 of 88 slices shown]
[im 1/88]
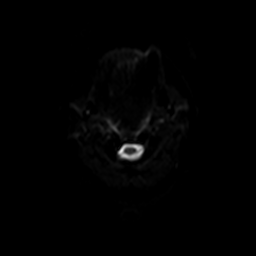
[im 30/88]
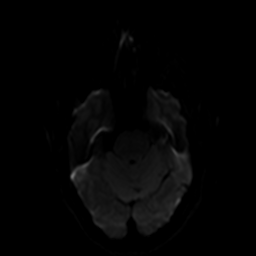
[im 59/88]
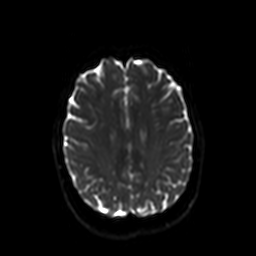
[im 88/88]
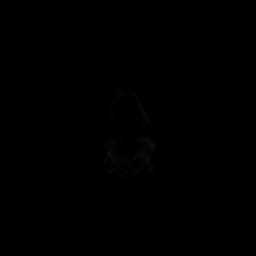

[Series 8: ax dwi_adc · axial · 3.0mm · 0.94mm/px · z∈[-94,+61]mm · 2 of 44 slices shown]
[im 1/44]
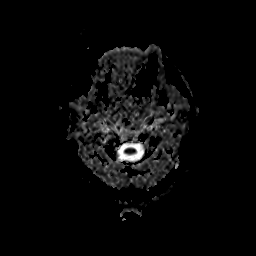
[im 44/44]
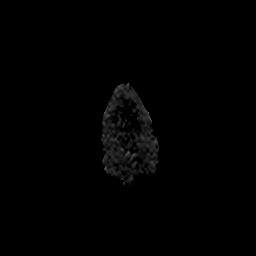

[Series 9: DWI · coronal · 5.0mm · 1.44mm/px · 3 of 68 slices shown (2 of 3)]
[im 1/68]
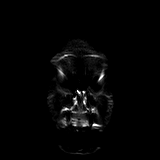
[im 34/68]
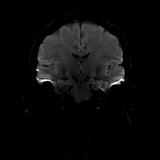
[im 68/68]
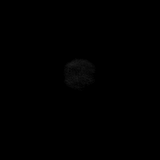

[Series 10: DWI · coronal · 5.0mm · 1.44mm/px · 2 of 34 slices shown (3 of 3)]
[im 1/34]
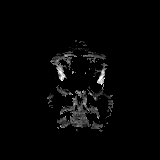
[im 34/34]
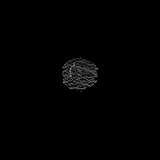

[Series 11: T2 · axial · 4.0mm · 0.36mm/px · z∈[-88,+62]mm · 2 of 30 slices shown]
[im 1/30]
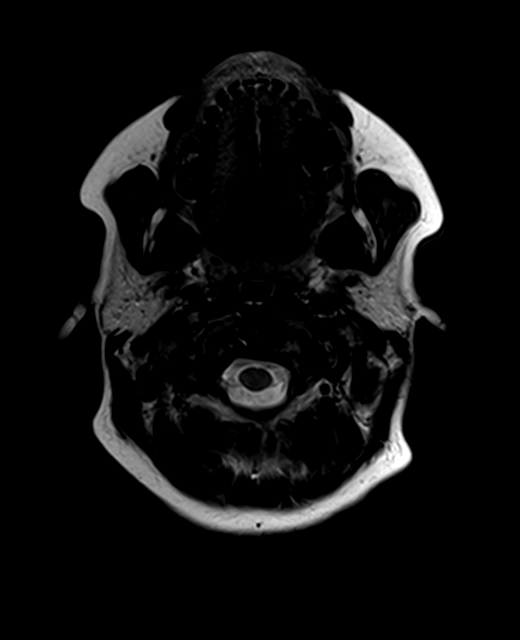
[im 30/30]
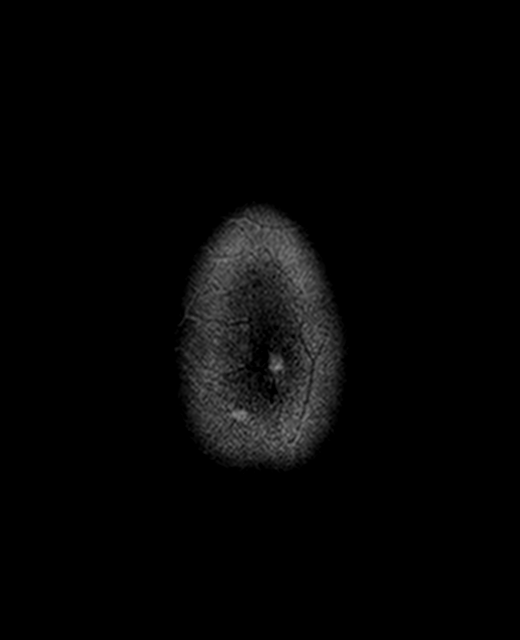

[Series 12: FLAIR · axial · 3.0mm · 0.72mm/px · 1 of 26 slices shown]
[im 1/26]
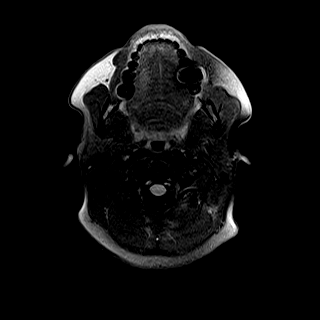

[Series 14: swi_images · axial · 3.0mm · 0.90mm/px · z∈[-107,+81]mm · 3 of 57 slices shown]
[im 1/57]
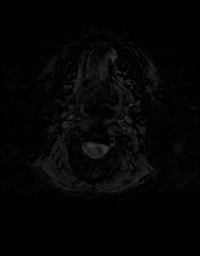
[im 29/57]
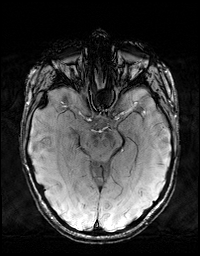
[im 57/57]
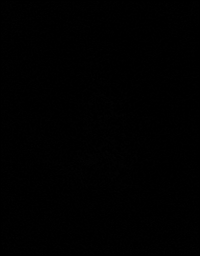

[Series 15: T1 · axial · 1.0mm · 0.90mm/px · z∈[-95,+63]mm · 8 of 159 slices shown (2 of 3)]
[im 1/159]
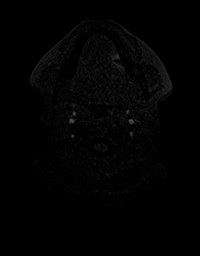
[im 23/159]
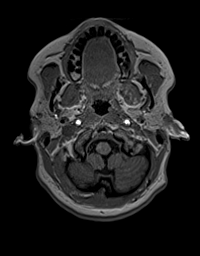
[im 46/159]
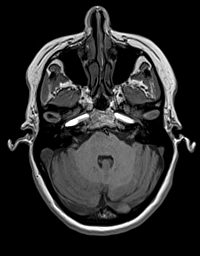
[im 68/159]
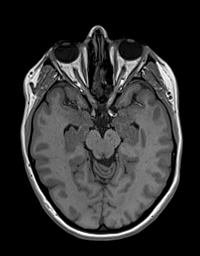
[im 91/159]
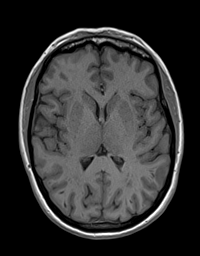
[im 113/159]
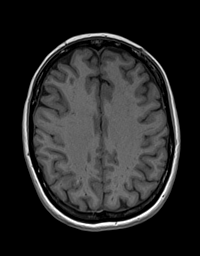
[im 136/159]
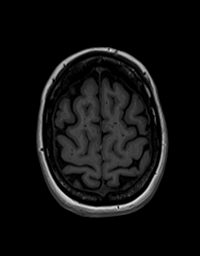
[im 159/159]
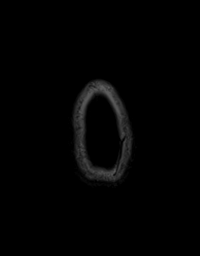

[Series 16: T2 post-contrast · coronal · 4.0mm · 0.36mm/px · 2 of 34 slices shown]
[im 1/34]
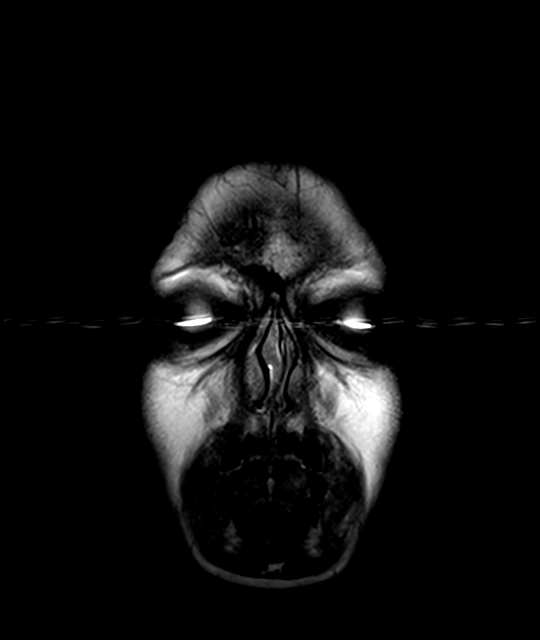
[im 34/34]
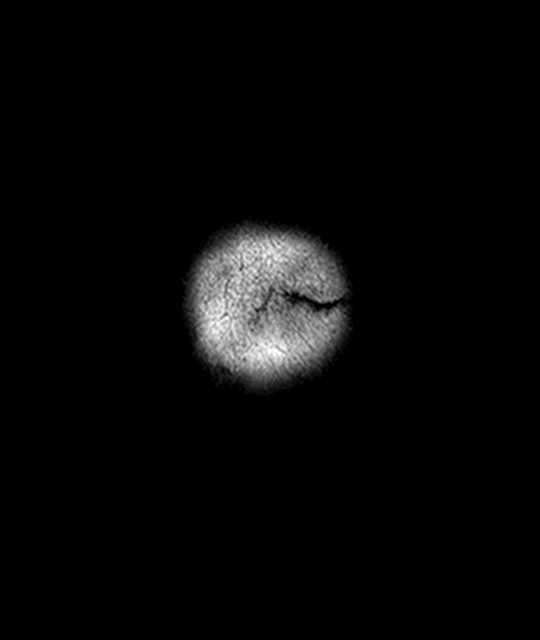

[Series 18: T1 post-contrast · coronal · 4.0mm · 0.72mm/px · 2 of 35 slices shown]
[im 1/35]
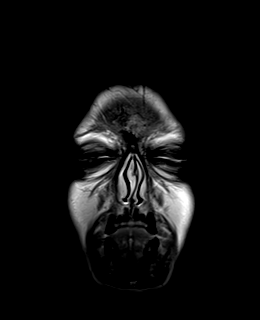
[im 35/35]
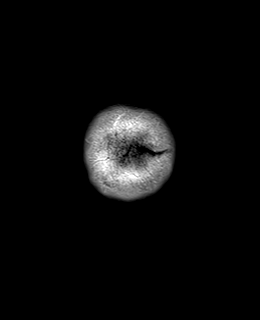

[Series 19: T1 · axial · 1.0mm · 0.90mm/px · z∈[-95,+63]mm · 8 of 159 slices shown (3 of 3)]
[im 1/159]
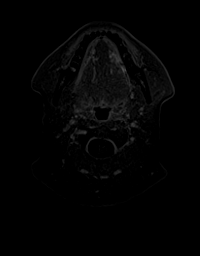
[im 23/159]
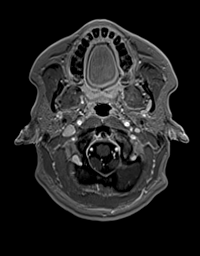
[im 46/159]
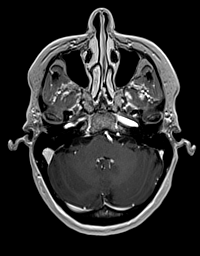
[im 68/159]
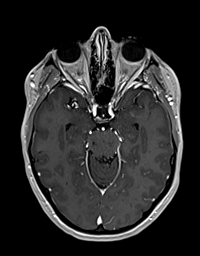
[im 91/159]
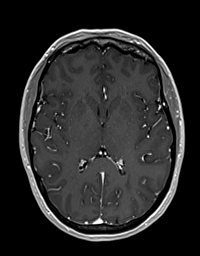
[im 113/159]
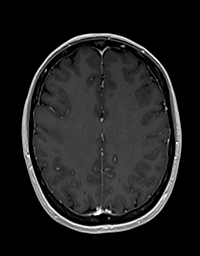
[im 136/159]
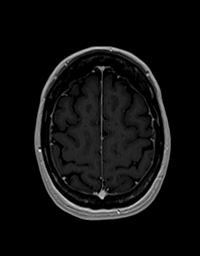
[im 159/159]
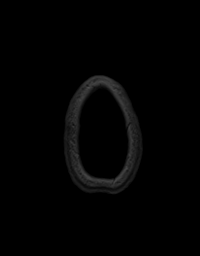

[48 of 48 positions shown; findings below may reference images not displayed]

FINDINGS: Brain: Normal cerebral volume. No restricted diffusion to suggest
acute infarction. No midline shift, mass effect, evidence of mass
lesion, ventriculomegaly, extra-axial collection or acute
intracranial hemorrhage. Normal basilar cisterns. Cervicomedullary
junction and pituitary are within normal limits.

Cavum septum pellucidum, normal variant.

Mild for age nonspecific mostly periventricular scattered and patchy
cerebral white matter T2 and FLAIR hyperintensity. No cortical
encephalomalacia. No chronic cerebral blood products on SWI. Deep
gray matter nuclei, brainstem and cerebellum appear negative. No
abnormal enhancement identified. No dural thickening.

Vascular: Major intracranial vascular flow voids are preserved. The
major dural venous sinuses are enhancing and appear to be patent.

Skull and upper cervical spine: Negative for age visible cervical
spine, C4-C5 disc and endplate degeneration. Visualized bone marrow
signal is within normal limits.

Sinuses/Orbits: Negative. Paranasal Visualized paranasal sinuses and
mastoids are stable and well aerated.

Other: Visible internal auditory structures appear normal. Negative
visible scalp and face.
IMPRESSION: No acute intracranial abnormality and largely unremarkable Brain
MRI. Mild for age nonspecific cerebral white matter signal changes.

## 2023-03-20 ENCOUNTER — Other Ambulatory Visit: Payer: Self-pay | Admitting: Internal Medicine

## 2023-03-20 DIAGNOSIS — Z1231 Encounter for screening mammogram for malignant neoplasm of breast: Secondary | ICD-10-CM

## 2023-04-10 ENCOUNTER — Ambulatory Visit
Admission: RE | Admit: 2023-04-10 | Discharge: 2023-04-10 | Disposition: A | Discharge status: Home / Self Care | Payer: BC Managed Care – PPO | Source: Ambulatory Visit | Attending: Internal Medicine | Admitting: Internal Medicine

## 2023-04-10 DIAGNOSIS — Z1231 Encounter for screening mammogram for malignant neoplasm of breast: Secondary | ICD-10-CM

## 2023-07-06 ENCOUNTER — Ambulatory Visit: Payer: BC Managed Care – PPO

## 2023-07-06 DIAGNOSIS — Z1211 Encounter for screening for malignant neoplasm of colon: Secondary | ICD-10-CM | POA: Diagnosis present

## 2023-07-06 DIAGNOSIS — K5730 Diverticulosis of large intestine without perforation or abscess without bleeding: Secondary | ICD-10-CM | POA: Diagnosis not present

## 2023-07-06 DIAGNOSIS — K640 First degree hemorrhoids: Secondary | ICD-10-CM | POA: Diagnosis not present
